# Patient Record
Sex: Female | Born: 2000 | Race: Black or African American | Hispanic: No | Marital: Single | Smoking: Never smoker
Health system: Southern US, Community
[De-identification: ages and names within clinical notes are randomized; demographics above are authoritative.]

## PROBLEM LIST (undated history)

## (undated) DIAGNOSIS — J45909 Unspecified asthma, uncomplicated: Secondary | ICD-10-CM

## (undated) HISTORY — PX: WISDOM TOOTH EXTRACTION: SHX21

---
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
## 2021-06-05 ENCOUNTER — Other Ambulatory Visit: Payer: Self-pay

## 2021-06-05 ENCOUNTER — Emergency Department (HOSPITAL_COMMUNITY): Payer: Medicaid Other

## 2021-06-05 ENCOUNTER — Encounter (HOSPITAL_COMMUNITY): Payer: Self-pay

## 2021-06-05 ENCOUNTER — Emergency Department (HOSPITAL_COMMUNITY)
Admission: EM | Admit: 2021-06-05 | Discharge: 2021-06-05 | Disposition: A | Discharge status: Home / Self Care | Payer: Medicaid Other | Attending: Emergency Medicine | Admitting: Emergency Medicine

## 2021-06-05 DIAGNOSIS — S8392XA Sprain of unspecified site of left knee, initial encounter: Secondary | ICD-10-CM | POA: Diagnosis not present

## 2021-06-05 DIAGNOSIS — Y92009 Unspecified place in unspecified non-institutional (private) residence as the place of occurrence of the external cause: Secondary | ICD-10-CM | POA: Diagnosis not present

## 2021-06-05 DIAGNOSIS — M25562 Pain in left knee: Secondary | ICD-10-CM | POA: Diagnosis not present

## 2021-06-05 DIAGNOSIS — W108XXA Fall (on) (from) other stairs and steps, initial encounter: Secondary | ICD-10-CM | POA: Insufficient documentation

## 2021-06-05 DIAGNOSIS — S8992XA Unspecified injury of left lower leg, initial encounter: Secondary | ICD-10-CM | POA: Diagnosis present

## 2021-06-05 DIAGNOSIS — Z91010 Allergy to peanuts: Secondary | ICD-10-CM | POA: Diagnosis not present

## 2021-06-05 DIAGNOSIS — W19XXXA Unspecified fall, initial encounter: Secondary | ICD-10-CM

## 2021-06-05 MED ORDER — HYDROCODONE-ACETAMINOPHEN 5-325 MG PO TABS: 1.0000 | ORAL_TABLET | ORAL | 0 refills | Status: DC | PRN

## 2021-06-05 NOTE — ED Provider Triage Note (Signed)
Emergency Medicine Provider Triage Evaluation Note ? ?Haley Murray , a 21 y.o. female  was evaluated in triage.  Pt complains of knee pain.  She states that she fell down the stairs 2 days ago.  She is up landing on her left knee.  She has since been unable to bear a lot of weight on her left knee.  She says she feels like it gives out every time she tries to ambulate.  She is having pain all around the kneecap.  She states she is having difficulty flexing her left knee.  She is able to slightly extend it.  Denies any numbness ? ?Review of Systems  ?Positive: Left knee pain ?Negative:  ? ?Physical Exam  ?BP 121/77 (BP Location: Left Arm)   Pulse 95   Temp 98.1 ?F (36.7 ?C) (Oral)   Resp 16   SpO2 100%  ?Gen:   Awake, no distress   ?Resp:  Normal effort  ?MSK:   Moves extremities without difficulty  ?Other:  Mild swelling to left knee. Difficulty with ext/flexion. Pedal pulses intact. Sensation intact. ? ?Medical Decision Making  ?Medically screening exam initiated at 1:13 PM.  Appropriate orders placed.  Haley Murray was informed that the remainder of the evaluation will be completed by another provider, this initial triage assessment does not replace that evaluation, and the importance of remaining in the ED until their evaluation is complete. ? ? ?  ?Claudie Leach, PA-C ?06/05/21 1315 ? ?

## 2021-06-05 NOTE — Discharge Instructions (Addendum)
Return if any problems.

## 2021-06-05 NOTE — Progress Notes (Signed)
Orthopedic Tech Progress Note ?Patient Details:  ?Haley Murray ?January 08, 2001 ?[id number] ? ?Ortho Devices ?Type of Ortho Device: Knee Immobilizer, Crutches ?Ortho Device/Splint Interventions: Ordered, Application, Adjustment ?  ?Post Interventions ?Patient Tolerated: Well ? ?Haley Murray ?06/05/2021, 3:13 PM ? ?

## 2021-06-05 NOTE — ED Triage Notes (Signed)
Pt arrived from home c/o a fall 2 days ago. Pt states she fell down the stairs unsure of how many stairs she fell down. Pt attempted to stand in the shower today and was unable to put any weight on it.  ?

## 2021-06-06 NOTE — ED Provider Notes (Signed)
?[facility] ?Provider Note ? ? ?CSN: [id number] ?Arrival date & time: 06/05/21  1300 ? ?  ? ?History ? ?Chief Complaint  ?Patient presents with  ? Fall  ? ? ?Haley Murray is a 21 y.o. female. ? ?Pt reports she fell yesterday.  Pt reports today she could not put weight on her knee., Pt feels like her left knee will give a way.  Pt reports her knee is swollen  ? ?The history is provided by the patient. No language interpreter was used.  ?Fall ?This is a new problem. The problem occurs constantly. The problem has not changed since onset.Pertinent negatives include no chest pain. Nothing aggravates the symptoms. Nothing relieves the symptoms. She has tried nothing for the symptoms. The treatment provided no relief.  ? ?  ? ?Home Medications ?Prior to Admission medications   ?Medication Sig Start Date End Date Taking? Authorizing Provider  ?HYDROcodone-acetaminophen (NORCO/VICODIN) 5-325 MG tablet Take 1 tablet by mouth every 4 (four) hours as needed for moderate pain. 06/05/21 06/05/22 Yes Fransico Meadow, PA-C  ?albuterol (PROVENTIL HFA;VENTOLIN HFA) 108 (90 Base) MCG/ACT inhaler Inhale 2 puffs into the lungs every 6 (six) hours as needed for wheezing or shortness of breath.    [provider]  ?albuterol (PROVENTIL) (5 MG/ML) 0.5% nebulizer solution Take 1 mL (5 mg total) by nebulization every 4 (four) hours as needed for wheezing or shortness of breath. 05/19/17   Molpus, John, MD  ?Etonogestrel [facility]) Inject into the skin.    [provider]  ?fluticasone (FLONASE) 50 MCG/ACT nasal spray Place into both nostrils daily.    [provider]  ?ibuprofen (ADVIL,MOTRIN) 600 MG tablet Take 1 tablet (600 mg total) by mouth every 8 (eight) hours as needed. 04/21/17   Petrucelli, Samantha R, PA-C  ?ondansetron (ZOFRAN ODT) 4 MG disintegrating tablet Take 1 tablet (4 mg total) by mouth every 8 (eight) hours as needed for nausea or vomiting. 04/21/17    Petrucelli, Glynda Jaeger, PA-C  ?   ? ?Allergies    ?Nickel and Peanut-containing drug products   ? ?Review of Systems   ?Review of Systems  ?Cardiovascular:  Negative for chest pain.  ?Musculoskeletal:  Positive for joint swelling and myalgias.  ?All other systems reviewed and are negative. ? ?Physical Exam ?Updated Vital Signs ?BP 121/77 (BP Location: Left Arm)   Pulse 95   Temp 98.1 ?F (36.7 ?C) (Oral)   Resp 16   Ht 5\' 5"  (1.651 m)   Wt 81.6 kg   LMP 05/30/2021   SpO2 100%   BMI 29.95 kg/m?  ?Physical Exam ?Vitals and nursing note reviewed.  ?Constitutional:   ?   Appearance: She is well-developed.  ?HENT:  ?   Head: Normocephalic.  ?Pulmonary:  ?   Effort: Pulmonary effort is normal.  ?Abdominal:  ?   General: There is no distension.  ?Musculoskeletal:     ?   General: Swelling and tenderness present.  ?   Cervical back: Normal range of motion.  ?   Comments: Decreased range of motion  nv and ns intact  Pain with movement  negative drawer  no medial of lateral instability   ?Neurological:  ?   Mental Status: She is alert and oriented to person, place, and time.  ? ? ?ED Results / Procedures / Treatments   ?Labs ?(all labs ordered are listed, but only abnormal results are displayed) ?Labs Reviewed - No data to display ? ?EKG ?None ? ?  Radiology ?DG Knee Complete 4 Views Left ? ?Result Date: 06/05/2021 ?CLINICAL DATA:  left knee pain EXAM: LEFT KNEE - COMPLETE 4+ VIEW COMPARISON:  None. FINDINGS: No evidence of acute fracture or joint malalignment. Possible small joint effusion. No significant degenerative change. IMPRESSION: 1. No evidence of acute fracture or joint malalignment. 2. Possible small joint effusion. Electronically Signed   By: Margaretha Sheffield M.D.   On: 06/05/2021 13:53   ? ?Procedures ?Procedures  ? ? ?Medications Ordered in ED ?Medications - No data to display ? ?ED Course/ Medical Decision Making/ A&P ?  ?                        ?Medical Decision Making ?Problems Addressed: ?Sprain of left  knee, unspecified ligament, initial encounter: acute illness or injury ?   Details: Fall yesterday ? ?Amount and/or Complexity of Data Reviewed ?Radiology: ordered and independent interpretation performed. Decision-making details documented in ED Course. ?   Details: xray shows a left knee small effusion ? ?Risk ?Prescription drug management. ?Risk Details: Pt placed in a knee in a knee immbolizer and given crutches  Pt advised to follow up with Orthopaedist  ? ? ? ? ? ? ? ? ? ? ?Final Clinical Impression(s) / ED Diagnoses ?Final diagnoses:  ?Fall, initial encounter  ?Sprain of left knee, unspecified ligament, initial encounter  ? ? ?Rx / DC Orders ?ED Discharge Orders   ? ?      Ordered  ?  HYDROcodone-acetaminophen (NORCO/VICODIN) 5-325 MG tablet  Every 4 hours PRN       ? 06/05/21 1449  ? ?  ?  ? ?  ? ?An After Visit Summary was printed and given to the patient. ? ?  ?Fransico Meadow, [place] ?06/06/21 1041 ? ?  ?Davonna Belling, MD ?06/09/21 1459 ? ?

## 2021-06-07 ENCOUNTER — Telehealth (HOSPITAL_COMMUNITY): Payer: Self-pay | Admitting: Physician Assistant

## 2021-06-07 ENCOUNTER — Telehealth: Payer: Self-pay

## 2021-06-07 MED ORDER — OXYCODONE-ACETAMINOPHEN 5-325 MG PO TABS: 1.0000 | ORAL_TABLET | Freq: Four times a day (QID) | ORAL | 0 refills | Status: DC | PRN

## 2021-06-07 MED ORDER — OXYCODONE-ACETAMINOPHEN 10-325 MG PO TABS: ORAL_TABLET | ORAL | 0 refills | Status: DC

## 2021-06-07 NOTE — Telephone Encounter (Signed)
Updating patient throughout the day about medication. Provider called in oxycodone to [facility] unfortunately they were out of that medication. Messaged Leanna Battles, PA provider back and she escribed over another medication.  ?

## 2021-06-07 NOTE — Telephone Encounter (Signed)
Pharmacy change for rx ?

## 2021-06-07 NOTE — Telephone Encounter (Signed)
Rx change due to shortages  ?

## 2021-06-10 DIAGNOSIS — M25562 Pain in left knee: Secondary | ICD-10-CM | POA: Insufficient documentation

## 2021-07-03 ENCOUNTER — Other Ambulatory Visit: Payer: Self-pay

## 2021-07-03 ENCOUNTER — Encounter (HOSPITAL_BASED_OUTPATIENT_CLINIC_OR_DEPARTMENT_OTHER): Payer: Self-pay | Admitting: Specialist

## 2021-07-03 NOTE — Progress Notes (Signed)
Patient called to complete pre op phone call and made nurse aware that she was pregnant. Chart reviewed by Dr. Casilda Carls and surgery cannot be done at [facility]. Left voicemail for Morrie Sheldon at Dr. Thomasena Edis office to make her aware.

## 2021-07-09 ENCOUNTER — Ambulatory Visit (HOSPITAL_BASED_OUTPATIENT_CLINIC_OR_DEPARTMENT_OTHER): Admission: RE | Admit: 2021-07-09 | Payer: Medicaid Other | Source: Home / Self Care | Admitting: Specialist

## 2021-07-09 SURGERY — KNEE ARTHROSCOPY WITH ANTERIOR CRUCIATE LIGAMENT (ACL) REPAIR
Anesthesia: General | Site: Knee | Laterality: Left

## 2021-08-28 DIAGNOSIS — O0932 Supervision of pregnancy with insufficient antenatal care, second trimester: Secondary | ICD-10-CM | POA: Insufficient documentation

## 2021-09-01 ENCOUNTER — Inpatient Hospital Stay (HOSPITAL_COMMUNITY)
Admission: AD | Admit: 2021-09-01 | Discharge: 2021-09-02 | Disposition: A | Discharge status: Home / Self Care | Payer: Medicaid Other | Attending: Obstetrics and Gynecology | Admitting: Obstetrics and Gynecology

## 2021-09-01 ENCOUNTER — Encounter (HOSPITAL_COMMUNITY): Payer: Self-pay | Admitting: Obstetrics and Gynecology

## 2021-09-01 ENCOUNTER — Inpatient Hospital Stay (HOSPITAL_COMMUNITY): Payer: Medicaid Other

## 2021-09-01 DIAGNOSIS — O26891 Other specified pregnancy related conditions, first trimester: Secondary | ICD-10-CM | POA: Insufficient documentation

## 2021-09-01 DIAGNOSIS — O99281 Endocrine, nutritional and metabolic diseases complicating pregnancy, first trimester: Secondary | ICD-10-CM | POA: Insufficient documentation

## 2021-09-01 DIAGNOSIS — O219 Vomiting of pregnancy, unspecified: Secondary | ICD-10-CM | POA: Diagnosis present

## 2021-09-01 DIAGNOSIS — E860 Dehydration: Secondary | ICD-10-CM | POA: Diagnosis not present

## 2021-09-01 DIAGNOSIS — A5901 Trichomonal vulvovaginitis: Secondary | ICD-10-CM | POA: Diagnosis not present

## 2021-09-01 DIAGNOSIS — O30041 Twin pregnancy, dichorionic/diamniotic, first trimester: Secondary | ICD-10-CM | POA: Diagnosis not present

## 2021-09-01 DIAGNOSIS — O98311 Other infections with a predominantly sexual mode of transmission complicating pregnancy, first trimester: Secondary | ICD-10-CM | POA: Diagnosis not present

## 2021-09-01 DIAGNOSIS — O23592 Infection of other part of genital tract in pregnancy, second trimester: Secondary | ICD-10-CM | POA: Insufficient documentation

## 2021-09-01 DIAGNOSIS — J4520 Mild intermittent asthma, uncomplicated: Secondary | ICD-10-CM | POA: Diagnosis not present

## 2021-09-01 DIAGNOSIS — Z3A13 13 weeks gestation of pregnancy: Secondary | ICD-10-CM | POA: Insufficient documentation

## 2021-09-01 LAB — URINALYSIS, ROUTINE W REFLEX MICROSCOPIC
Bilirubin Urine: NEGATIVE
Glucose, UA: NEGATIVE mg/dL
Hgb urine dipstick: NEGATIVE
Ketones, ur: 20 mg/dL — AB
Nitrite: NEGATIVE
Protein, ur: 30 mg/dL — AB
Specific Gravity, Urine: 1.026 (ref 1.005–1.030)
pH: 6 (ref 5.0–8.0)

## 2021-09-01 LAB — COMPREHENSIVE METABOLIC PANEL
ALT: 14 U/L (ref 0–44)
AST: 15 U/L (ref 15–41)
Albumin: 3.2 g/dL — ABNORMAL LOW (ref 3.5–5.0)
Alkaline Phosphatase: 65 U/L (ref 38–126)
Anion gap: 9 (ref 5–15)
BUN: 8 mg/dL (ref 6–20)
CO2: 18 mmol/L — ABNORMAL LOW (ref 22–32)
Calcium: 9.1 mg/dL (ref 8.9–10.3)
Chloride: 107 mmol/L (ref 98–111)
Creatinine, Ser: 0.71 mg/dL (ref 0.44–1.00)
GFR, Estimated: >60 mL/min (ref >60)
Glucose, Bld: 94 mg/dL (ref 70–99)
Potassium: 3.4 mmol/L — ABNORMAL LOW (ref 3.5–5.1)
Sodium: 134 mmol/L — ABNORMAL LOW (ref 135–145)
Total Bilirubin: 0.5 mg/dL (ref 0.3–1.2)
Total Protein: 6.9 g/dL (ref 6.5–8.1)

## 2021-09-01 LAB — HCG, QUANTITATIVE, PREGNANCY: hCG, Beta Chain, Quant, S: 244500 m[IU]/mL — ABNORMAL HIGH (ref <5)

## 2021-09-01 LAB — CBC
HCT: 35.3 % — ABNORMAL LOW (ref 36.0–46.0)
Hemoglobin: 11.4 g/dL — ABNORMAL LOW (ref 12.0–15.0)
MCH: 22.1 pg — ABNORMAL LOW (ref 26.0–34.0)
MCHC: 32.3 g/dL (ref 30.0–36.0)
MCV: 68.4 fL — ABNORMAL LOW (ref 80.0–100.0)
Platelets: 334 10*3/uL (ref 150–400)
RBC: 5.16 MIL/uL — ABNORMAL HIGH (ref 3.87–5.11)
RDW: 14.2 % (ref 11.5–15.5)
WBC: 13.6 10*3/uL — ABNORMAL HIGH (ref 4.0–10.5)
nRBC: 0 % (ref 0.0–0.2)

## 2021-09-01 LAB — WET PREP, GENITAL
Sperm: NONE SEEN
WBC, Wet Prep HPF POC: >=10 — AB (ref <10)
Yeast Wet Prep HPF POC: NONE SEEN

## 2021-09-01 LAB — POCT PREGNANCY, URINE: Preg Test, Ur: POSITIVE — AB

## 2021-09-01 MED ORDER — SODIUM CHLORIDE 0.9 % IV SOLN
12.5000 mg | Freq: Once | INTRAVENOUS | Status: AC
  Administered 2021-09-02: 12.5 mg via INTRAVENOUS
  Filled 2021-09-01: qty 0.5

## 2021-09-01 MED ORDER — LACTATED RINGERS IV SOLN: Freq: Once | INTRAVENOUS | Status: AC

## 2021-09-01 MED ORDER — SCOPOLAMINE 1 MG/3DAYS TD PT72
1.0000 | MEDICATED_PATCH | Freq: Once | TRANSDERMAL | Status: DC
  Administered 2021-09-02: 1.5 mg via TRANSDERMAL
  Filled 2021-09-01: qty 1

## 2021-09-01 NOTE — MAU Provider Note (Signed)
Chief Complaint: Emesis and Abdominal Pain   Event Date/Time   First Provider Initiated Contact with Patient 09/01/21 2321        SUBJECTIVE HPI: Haley Murray is a 21 y.o. G1P0 at [redacted]w[redacted]d by LMP who presents to maternity admissions reporting nausea and vomiting and some lower abdominal cramping.  States was doing well with Zofran and Phenergan but ran out of prescriptions and could not get a refill ordered. . She denies vaginal bleeding, vaginal itching/burning, urinary symptoms, h/a, dizziness, or fever/chills.    Emesis  This is a recurrent problem. The problem has been unchanged. There has been no fever. Associated symptoms include abdominal pain. Pertinent negatives include no chest pain, chills, diarrhea, fever or myalgias. She has tried nothing for the symptoms.  Abdominal Pain This is a new problem. The current episode started in the past 7 days. The onset quality is gradual. The problem occurs intermittently. The problem is unchanged. The pain is located in the suprapubic region. The quality of the pain is described as cramping. Associated symptoms include vomiting. Pertinent negatives include no diarrhea, fever, frequency or myalgias. Nothing relieves the symptoms. Past treatments include nothing.   RN Note Haley Murray is a 21 y.o. at [redacted]w[redacted]d here in MAU reporting: N/V has not been able to keep anything down in a week. Had Phenergan and zofran prescribed and was taking it but ran out of it. Reports some abdominal cramping. Denies vaginal bleeding  Past Medical History:  Diagnosis Date   Asthma    No past surgical history on file. Social History   Socioeconomic History   Marital status: Single    Spouse name: Not on file   Number of children: Not on file   Years of education: Not on file   Highest education level: Not on file  Occupational History   Not on file  Tobacco Use   Smoking status: Never   Smokeless tobacco: Never  Vaping Use   Vaping Use: Never used  Substance and  Sexual Activity   Alcohol use: No   Drug use: No   Sexual activity: Not on file  Other Topics Concern   Not on file  Social History Narrative   Not on file   Social Determinants of Health   Financial Resource Strain: Not on file  Food Insecurity: Not on file  Transportation Needs: Not on file  Physical Activity: Not on file  Stress: Not on file  Social Connections: Not on file  Intimate Partner Violence: Not on file   No current facility-administered medications on file prior to encounter.   Current Outpatient Medications on File Prior to Encounter  Medication Sig Dispense Refill   albuterol (PROVENTIL HFA;VENTOLIN HFA) 108 (90 Base) MCG/ACT inhaler Inhale 2 puffs into the lungs every 6 (six) hours as needed for wheezing or shortness of breath.     albuterol (PROVENTIL) (5 MG/ML) 0.5% nebulizer solution Take 1 mL (5 mg total) by nebulization every 4 (four) hours as needed for wheezing or shortness of breath. 30 vial 0   Etonogestrel (NEXPLANON [place]) Inject into the skin.     fluticasone (FLONASE) 50 MCG/ACT nasal spray Place into both nostrils daily.     HYDROcodone-acetaminophen (NORCO/VICODIN) 5-325 MG tablet Take 1 tablet by mouth every 4 (four) hours as needed for moderate pain. 20 tablet 0   ibuprofen (ADVIL,MOTRIN) 600 MG tablet Take 1 tablet (600 mg total) by mouth every 8 (eight) hours as needed. 30 tablet 0   ondansetron (ZOFRAN ODT) 4  MG disintegrating tablet Take 1 tablet (4 mg total) by mouth every 8 (eight) hours as needed for nausea or vomiting. 6 tablet 0   oxyCODONE-acetaminophen (PERCOCET) 10-325 MG tablet !/2 tablet every 6 hours prn pain 10 tablet 0   Allergies  Allergen Reactions   Nickel Hives   Peanut-Containing Drug Products    Amoxicillin Rash    I have reviewed patient's Past Medical Hx, Surgical Hx, Family Hx, Social Hx, medications and allergies.   ROS:  Review of Systems  Constitutional:  Negative for chills and fever.  Cardiovascular:  Negative  for chest pain.  Gastrointestinal:  Positive for abdominal pain and vomiting. Negative for diarrhea.  Genitourinary:  Negative for frequency.  Musculoskeletal:  Negative for myalgias.   Review of Systems  Other systems negative   Physical Exam  Physical Exam Patient Vitals for the past 24 hrs:  BP Temp Temp src Pulse Resp SpO2 Height Weight  09/01/21 2230 122/77 98.4 F (36.9 C) Oral 84 17 100 % -- --  09/01/21 2228 -- -- -- -- -- -- 5\' 5"  (1.651 m) 87.6 kg   Constitutional: Well-developed, well-nourished female in no acute distress.  Cardiovascular: normal rate Respiratory: normal effort GI: Abd soft, non-tender. Pos BS x 4 MS: Extremities nontender, no edema, normal ROM Neurologic: Alert and oriented x 4.  GU: Neg CVAT.  PELVIC EXAM: deferred in lieu of transvaginal ultrasound                           Cultures and wet prep collected  LAB RESULTS Results for orders placed or performed during the hospital encounter of 09/01/21 (from the past 24 hour(s))  CBC     Status: Abnormal   Collection Time: 09/01/21 10:09 PM  Result Value Ref Range   WBC 13.6 (H) 4.0 - 10.5 K/uL   RBC 5.16 (H) 3.87 - 5.11 MIL/uL   Hemoglobin 11.4 (L) 12.0 - 15.0 g/dL   HCT 09/03/21 (L) 60.6 - 30.1 %   MCV 68.4 (L) 80.0 - 100.0 fL   MCH 22.1 (L) 26.0 - 34.0 pg   MCHC 32.3 30.0 - 36.0 g/dL   RDW 60.1 09.3 - 23.5 %   Platelets 334 150 - 400 K/uL   nRBC 0.0 0.0 - 0.2 %  Wet prep, genital     Status: Abnormal   Collection Time: 09/01/21 10:48 PM   Specimen: Urine, Clean Catch  Result Value Ref Range   Yeast Wet Prep HPF POC NONE SEEN NONE SEEN   Trich, Wet Prep PRESENT (A) NONE SEEN   Clue Cells Wet Prep HPF POC PRESENT (A) NONE SEEN   WBC, Wet Prep HPF POC >=10 (A) <10   Sperm NONE SEEN   Urinalysis, Routine w reflex microscopic Urine, Clean Catch     Status: Abnormal   Collection Time: 09/01/21 10:48 PM  Result Value Ref Range   Color, Urine YELLOW YELLOW   APPearance HAZY (A) CLEAR    Specific Gravity, Urine 1.026 1.005 - 1.030   pH 6.0 5.0 - 8.0   Glucose, UA NEGATIVE NEGATIVE mg/dL   Hgb urine dipstick NEGATIVE NEGATIVE   Bilirubin Urine NEGATIVE NEGATIVE   Ketones, ur 20 (A) NEGATIVE mg/dL   Protein, ur 30 (A) NEGATIVE mg/dL   Nitrite NEGATIVE NEGATIVE   Leukocytes,Ua SMALL (A) NEGATIVE   RBC / HPF 0-5 0 - 5 RBC/hpf   WBC, UA 21-50 0 - 5 WBC/hpf   Bacteria, UA  RARE (A) NONE SEEN   Squamous Epithelial / LPF 0-5 0 - 5   Mucus PRESENT   Pregnancy, urine POC     Status: Abnormal   Collection Time: 09/01/21 10:50 PM  Result Value Ref Range   Preg Test, Ur POSITIVE (A) NEGATIVE       IMAGING US OB Comp Less 14 Wks  Result Date: 09/01/2021 CLINICAL DATA:  Quantitative beta HCG is not provided. EXAM: TWIN OBSTETRICAL ULTRASOUND <14 WKS TECHNIQUE: Transabdominal ultrasound was performed for evaluation of the gestation as well as the maternal uterus and adnexal regions. COMPARISON:  None Available. FINDINGS: Number of IUPs:  2 Chorionicity/Amnionicity:  Dichorionic diamniotic TWIN 1 Yolk sac:  Not Visualized. Embryo:  Visualized. Cardiac Activity: Visualized. Heart Rate: 158 bpm CRL:   67 mm   13 w 0 d                  [place] EDC: 03/09/2022 TWIN 2 Yolk sac:  Not Visualized. Embryo:  Visualized. Cardiac Activity: Visualized. Heart Rate: 158 bpm CRL:   71 mm   13 w 2 d                  [place] EDC: 03/07/2022 Subchorionic hemorrhage:  None visualized. Maternal uterus/adnexae: Uterus is anteverted. Both ovaries are visualized. No free fluid. IMPRESSION: Twin intrauterine pregnancy. Size is concordant. Twin 1 estimated gestational age is 13 weeks 0 days and Twin 2 is 13 weeks 2 days, by crown rump length. No acute complication is demonstrated sonographically. Electronically Signed   By: Burman Nieves M.D.   On: 09/01/2021 23:45   US OB Comp AddL Gest Less 14 Wks  Result Date: 09/01/2021 CLINICAL DATA:  Quantitative beta HCG is not provided. EXAM: TWIN OBSTETRICAL ULTRASOUND <14 WKS  TECHNIQUE: Transabdominal ultrasound was performed for evaluation of the gestation as well as the maternal uterus and adnexal regions. COMPARISON:  None Available. FINDINGS: Number of IUPs:  2 Chorionicity/Amnionicity:  Dichorionic diamniotic TWIN 1 Yolk sac:  Not Visualized. Embryo:  Visualized. Cardiac Activity: Visualized. Heart Rate: 158 bpm CRL:   67 mm   13 w 0 d                  [place] EDC: 03/09/2022 TWIN 2 Yolk sac:  Not Visualized. Embryo:  Visualized. Cardiac Activity: Visualized. Heart Rate: 158 bpm CRL:   71 mm   13 w 2 d                  [place] EDC: 03/07/2022 Subchorionic hemorrhage:  None visualized. Maternal uterus/adnexae: Uterus is anteverted. Both ovaries are visualized. No free fluid. IMPRESSION: Twin intrauterine pregnancy. Size is concordant. Twin 1 estimated gestational age is 13 weeks 0 days and Twin 2 is 13 weeks 2 days, by crown rump length. No acute complication is demonstrated sonographically. Electronically Signed   By: Burman Nieves M.D.   On: 09/01/2021 23:45     MAU Management/MDM: I have reviewed the triage vital signs and the nursing notes.   Pertinent labs & imaging results that were available during my care of the patient were reviewed by me and considered in my medical decision making (see chart for details).      I have reviewed her medical records including past results, notes and treatments.   Ordered usual first trimester r/o ectopic labs.   Pelvic cultures done Will check baseline Ultrasound to rule out ectopic.    Treatments in MAU included IV hydration and Phenergan and  scopolamine for nausea These were effective in alleviating her nausea.. She was then able to tolerate PO intake.  This bleeding/pain can represent a normal pregnancy with bleeding, spontaneous abortion or even an ectopic which can be life-threatening.  The process as listed above helps to determine which of these is present.  Informed patient of Trichomonas on wet prep.   Discussed treatment  plan and Expedited Partner Therapy Rx given for partner. Patient was very distraught and was overheard screaming at partner on phone.  He initially blamed her for passing it to him but later agree it was him that gave it to her and he agreed to treatment.   ASSESSMENT Twin IUP at [redacted]w[redacted]d Nausea and vomiting  Mild dehydration Trichomonal vaginitis Abdominal cramping  PLAN Discharge home Rx Flagyl for Trich, partner Rx given Rx Phenergan and zofran for nausea Rx Albuterol inhaler refill Message sent to Office to schedule New OB  Pt stable at time of discharge. Encouraged to return here if she develops worsening of symptoms, increase in pain, fever, or other concerning symptoms.    Wynelle Bourgeois CNM, MSN Certified Nurse-Midwife 09/01/2021  11:21 PM

## 2021-09-01 NOTE — MAU Note (Signed)
..  Haley Murray is a 21 y.o. at [redacted]w[redacted]d here in MAU reporting: N/V has not been able to keep anything down in a week. Had Phenergan and zofran prescribed and was taking it but ran out of it. Reports some abdominal cramping. Denies vaginal bleeding.   Pain score: 4.5/10 Vitals:   09/01/21 2230  BP: 122/77  Pulse: 84  Resp: 17  Temp: 98.4 F (36.9 C)  SpO2: 100%     FHT: unable to doppler twins with single doppler. Will notify provider.

## 2021-09-02 ENCOUNTER — Encounter (HOSPITAL_COMMUNITY): Payer: Self-pay | Admitting: Obstetrics and Gynecology

## 2021-09-02 ENCOUNTER — Telehealth: Payer: Self-pay | Admitting: Family Medicine

## 2021-09-02 DIAGNOSIS — E860 Dehydration: Secondary | ICD-10-CM

## 2021-09-02 DIAGNOSIS — J4520 Mild intermittent asthma, uncomplicated: Secondary | ICD-10-CM

## 2021-09-02 DIAGNOSIS — A5901 Trichomonal vulvovaginitis: Secondary | ICD-10-CM

## 2021-09-02 DIAGNOSIS — O219 Vomiting of pregnancy, unspecified: Secondary | ICD-10-CM

## 2021-09-02 DIAGNOSIS — O23592 Infection of other part of genital tract in pregnancy, second trimester: Secondary | ICD-10-CM

## 2021-09-02 DIAGNOSIS — Z3A13 13 weeks gestation of pregnancy: Secondary | ICD-10-CM

## 2021-09-02 LAB — GC/CHLAMYDIA PROBE AMP (~~LOC~~) NOT AT ARMC
Chlamydia: NEGATIVE
Comment: NEGATIVE
Comment: NORMAL
Neisseria Gonorrhea: NEGATIVE

## 2021-09-02 MED ORDER — METRONIDAZOLE 500 MG PO TABS: 500.0000 mg | ORAL_TABLET | Freq: Two times a day (BID) | ORAL | 0 refills | Status: AC

## 2021-09-02 MED ORDER — TRANSDERM-SCOP 1 MG/3DAYS TD PT72: 1.0000 | MEDICATED_PATCH | TRANSDERMAL | 12 refills | Status: AC | PRN

## 2021-09-02 MED ORDER — ALBUTEROL SULFATE HFA 108 (90 BASE) MCG/ACT IN AERS: 1.0000 | INHALATION_SPRAY | Freq: Four times a day (QID) | RESPIRATORY_TRACT | 0 refills | Status: AC | PRN

## 2021-09-02 MED ORDER — PROMETHAZINE HCL 25 MG PO TABS: 25.0000 mg | ORAL_TABLET | Freq: Four times a day (QID) | ORAL | 2 refills | Status: AC | PRN

## 2021-09-02 MED ORDER — ONDANSETRON 4 MG PO TBDP: 4.0000 mg | ORAL_TABLET | Freq: Four times a day (QID) | ORAL | 0 refills | Status: AC | PRN

## 2021-09-02 NOTE — Telephone Encounter (Signed)
Called patient to schedule new ob visits, there was no answer to the phone call and the option to leave a voicemail was unavailable so a letter was mailed.  

## 2021-09-09 DIAGNOSIS — O0990 Supervision of high risk pregnancy, unspecified, unspecified trimester: Secondary | ICD-10-CM | POA: Insufficient documentation

## 2021-09-09 NOTE — Progress Notes (Signed)
I connected with  Bing Matter on 09/09/21 at  9:15 AM EDT by MyChart Video Visit and verified that I am speaking with the correct person using two identifiers.   I discussed the limitations, risks, security and privacy concerns of performing an evaluation and management service by telephone and the availability of in person appointments. I also discussed with the patient that there may be a patient responsible charge related to this service. The patient expressed understanding and agreed to proceed.  Patient informed me that she recently relocated to [place], [place] and she has already found a office named "Family Medicine" that she will be receiving prenatal care from. I explained that I will cancel all her future appointments with our office.    Guy Begin, CMA 09/09/2021  9:27 AM

## 2021-10-08 ENCOUNTER — Encounter: Payer: Medicaid Other | Admitting: Family Medicine

## 2021-11-12 NOTE — Progress Notes (Signed)
This encounter was created in error - please disregard.

## 2023-11-04 IMAGING — DX DG KNEE COMPLETE 4+V*L*
4 series · 4 of 4 positions shown · non-contrast
Comparison: None.

CLINICAL DATA: left knee pain

EXAM:
LEFT KNEE - COMPLETE 4+ VIEW

[knee ap]
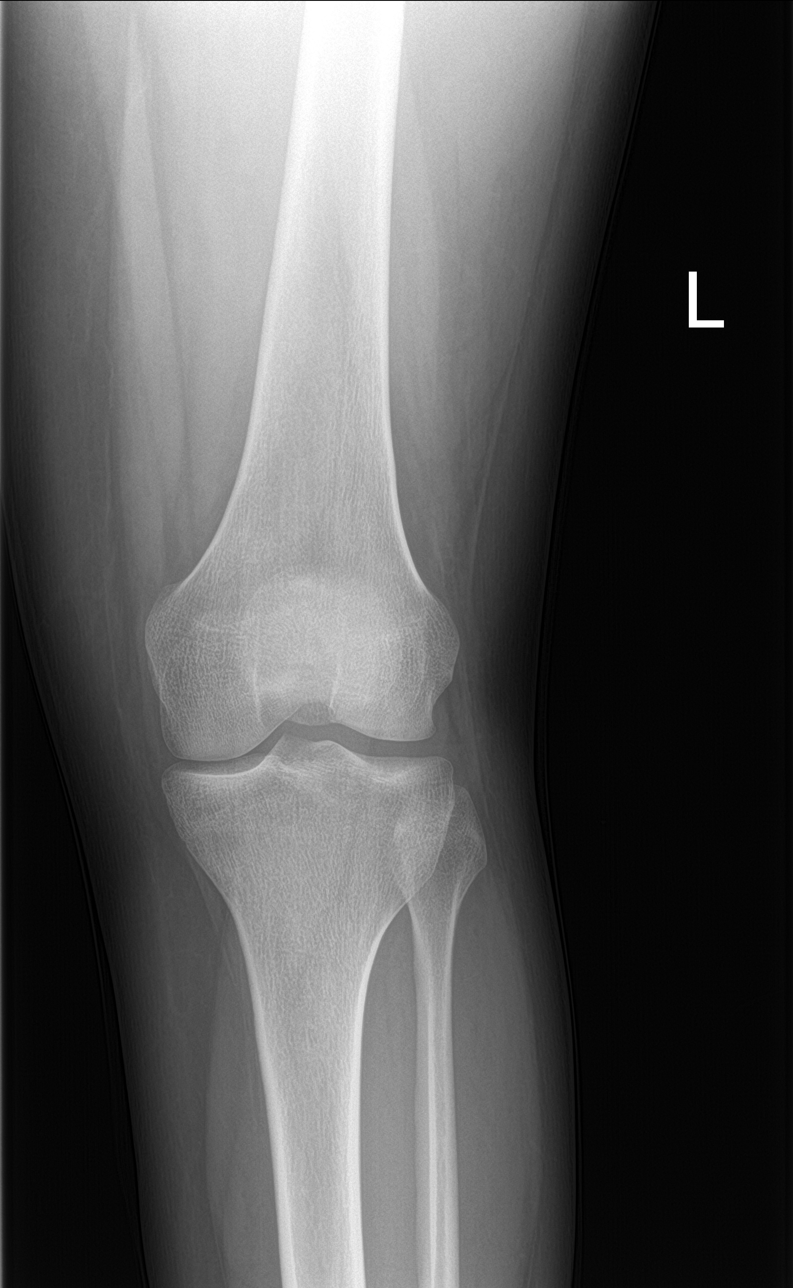

[knee obl (1 of 2)]
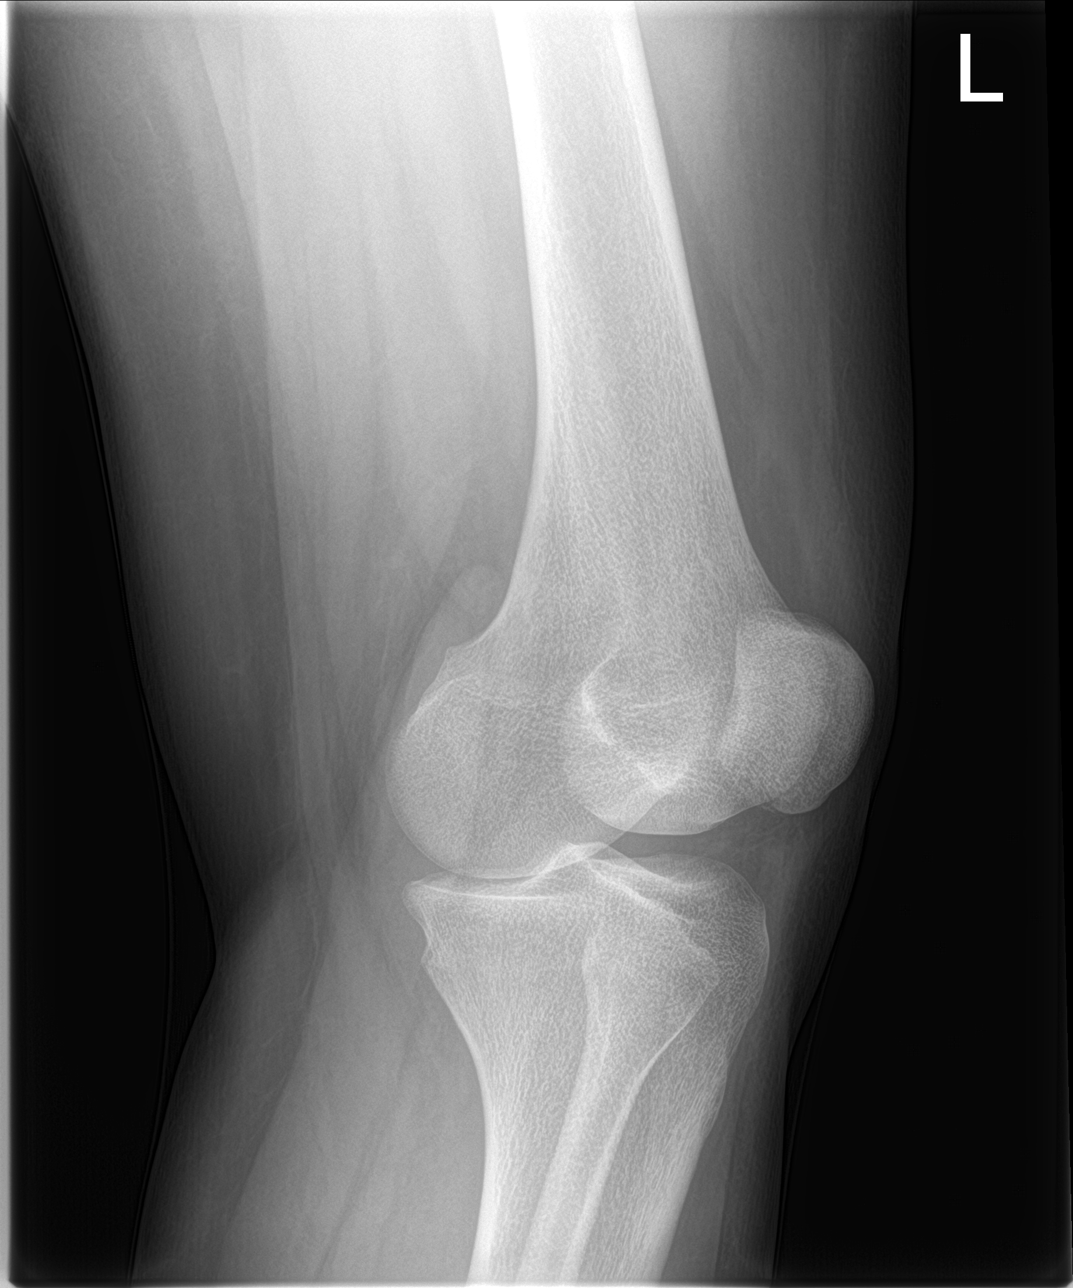

[knee obl (2 of 2)]
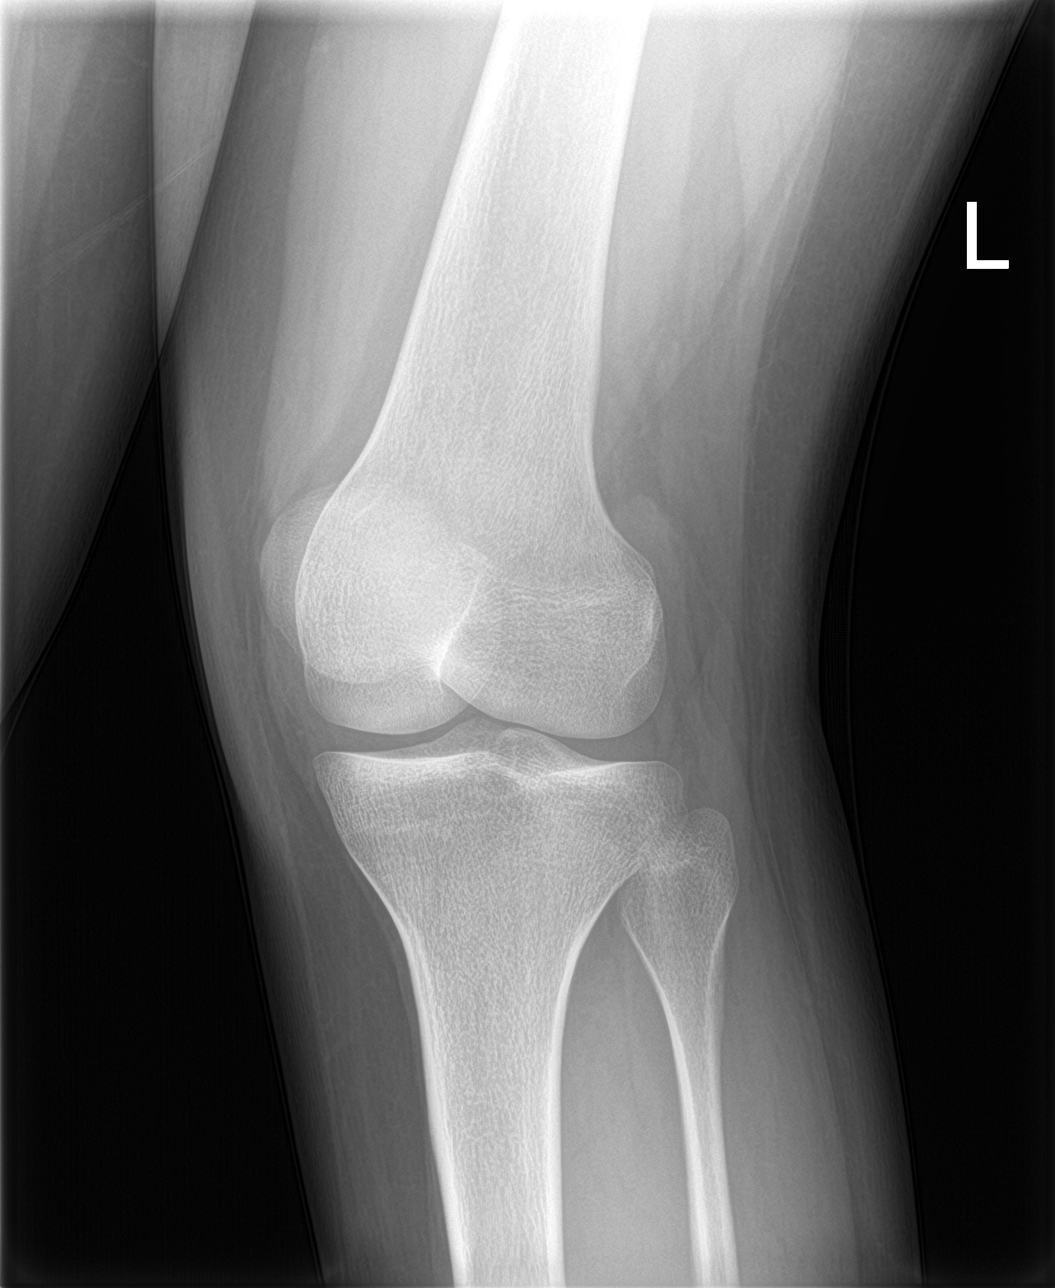

[knee lat]
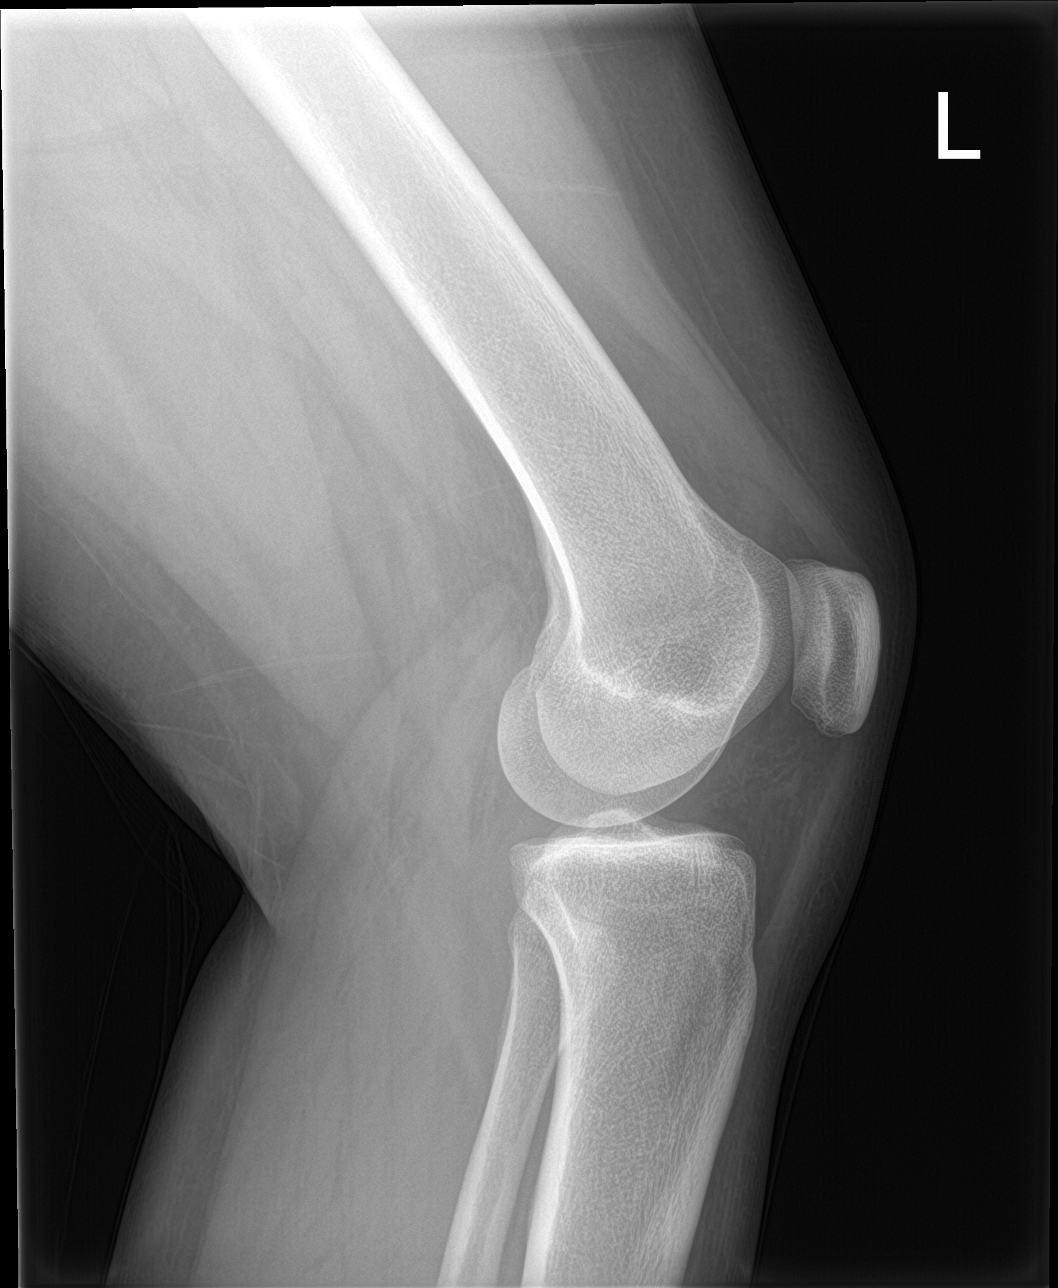

[4 of 4 positions shown; findings below may reference images not displayed]

FINDINGS: No evidence of acute fracture or joint malalignment. Possible small
joint effusion. No significant degenerative change.
IMPRESSION: 1. No evidence of acute fracture or joint malalignment.
2. Possible small joint effusion.
